# Patient Record
Sex: Male | Born: 1995 | Race: White | Hispanic: No | Marital: Single | Smoking: Never smoker
Health system: Southern US, Community
[De-identification: ages and names within clinical notes are randomized; demographics above are authoritative.]

---

## 2020-03-16 ENCOUNTER — Emergency Department
Admission: EM | Admit: 2020-03-16 | Discharge: 2020-03-16 | Disposition: A | Discharge status: Home / Self Care | Payer: Medicaid Other | Attending: Emergency Medicine | Admitting: Emergency Medicine

## 2020-03-16 ENCOUNTER — Emergency Department: Payer: Medicaid Other

## 2020-03-16 ENCOUNTER — Other Ambulatory Visit: Payer: Self-pay

## 2020-03-16 DIAGNOSIS — R079 Chest pain, unspecified: Secondary | ICD-10-CM

## 2020-03-16 DIAGNOSIS — K209 Esophagitis, unspecified without bleeding: Secondary | ICD-10-CM | POA: Insufficient documentation

## 2020-03-16 LAB — CBC
HCT: 45.7 % (ref 39.0–52.0)
Hemoglobin: 15.4 g/dL (ref 13.0–17.0)
MCH: 27.9 pg (ref 26.0–34.0)
MCHC: 33.7 g/dL (ref 30.0–36.0)
MCV: 82.8 fL (ref 80.0–100.0)
Platelets: 224 10*3/uL (ref 150–400)
RBC: 5.52 MIL/uL (ref 4.22–5.81)
RDW: 12.1 % (ref 11.5–15.5)
WBC: 6.8 10*3/uL (ref 4.0–10.5)
nRBC: 0 % (ref 0.0–0.2)

## 2020-03-16 LAB — TROPONIN I (HIGH SENSITIVITY)
Troponin I (High Sensitivity): 3 ng/L (ref <18)
Troponin I (High Sensitivity): 3 ng/L (ref <18)

## 2020-03-16 LAB — BASIC METABOLIC PANEL
Anion gap: 8 (ref 5–15)
BUN: 8 mg/dL (ref 6–20)
CO2: 26 mmol/L (ref 22–32)
Calcium: 9.6 mg/dL (ref 8.9–10.3)
Chloride: 102 mmol/L (ref 98–111)
Creatinine, Ser: 0.92 mg/dL (ref 0.61–1.24)
GFR, Estimated: >60 mL/min (ref >60)
Glucose, Bld: 91 mg/dL (ref 70–99)
Potassium: 4 mmol/L (ref 3.5–5.1)
Sodium: 136 mmol/L (ref 135–145)

## 2020-03-16 IMAGING — CR DG CHEST 2V
1 series · 2 of 2 positions shown · non-contrast
Comparison: None.

CLINICAL DATA: Chest pain

EXAM:
CHEST - 2 VIEW

[Series 1: dg chest 2 view · 0.14mm/px · 2 of 2 slices shown]
[im 1/2]
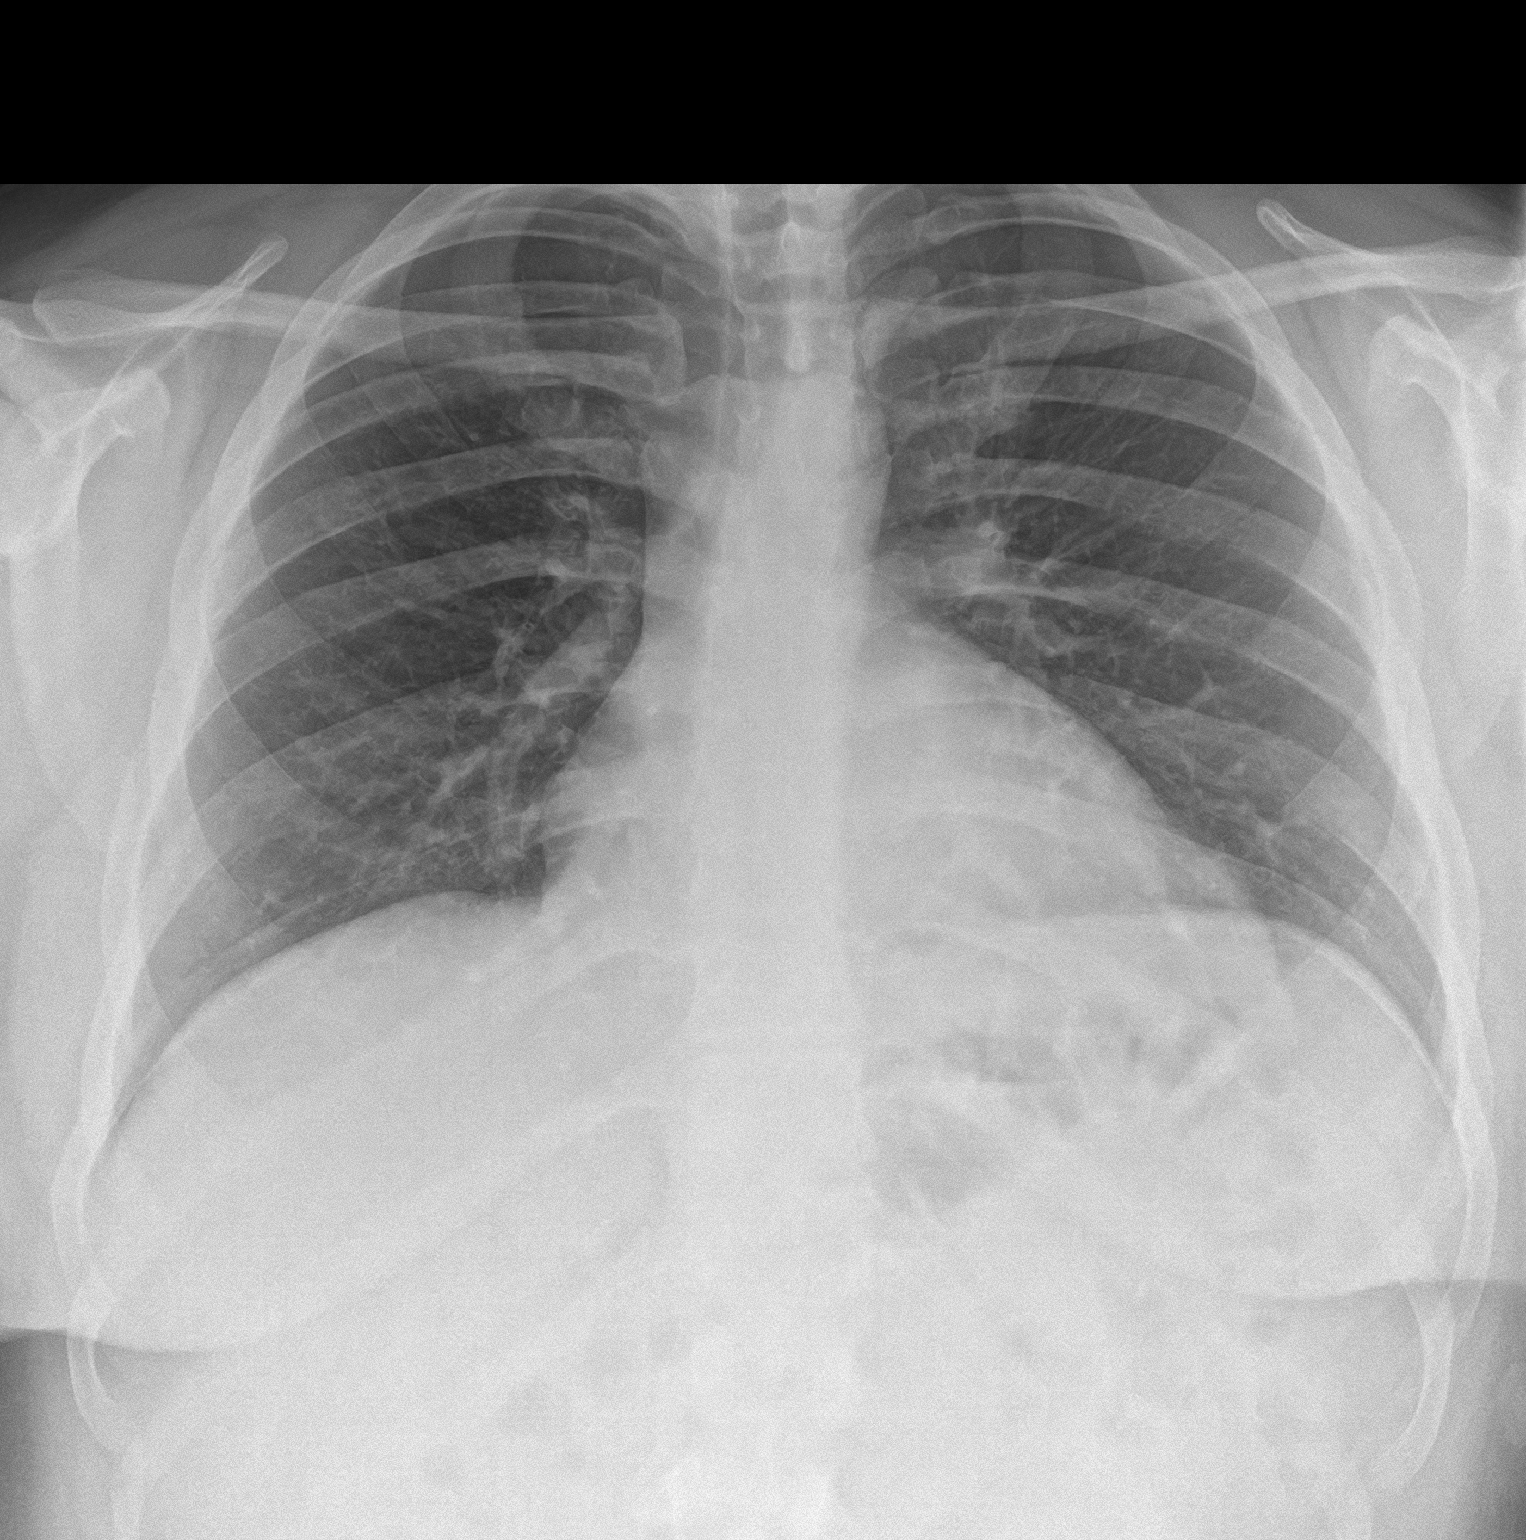
[im 2/2]
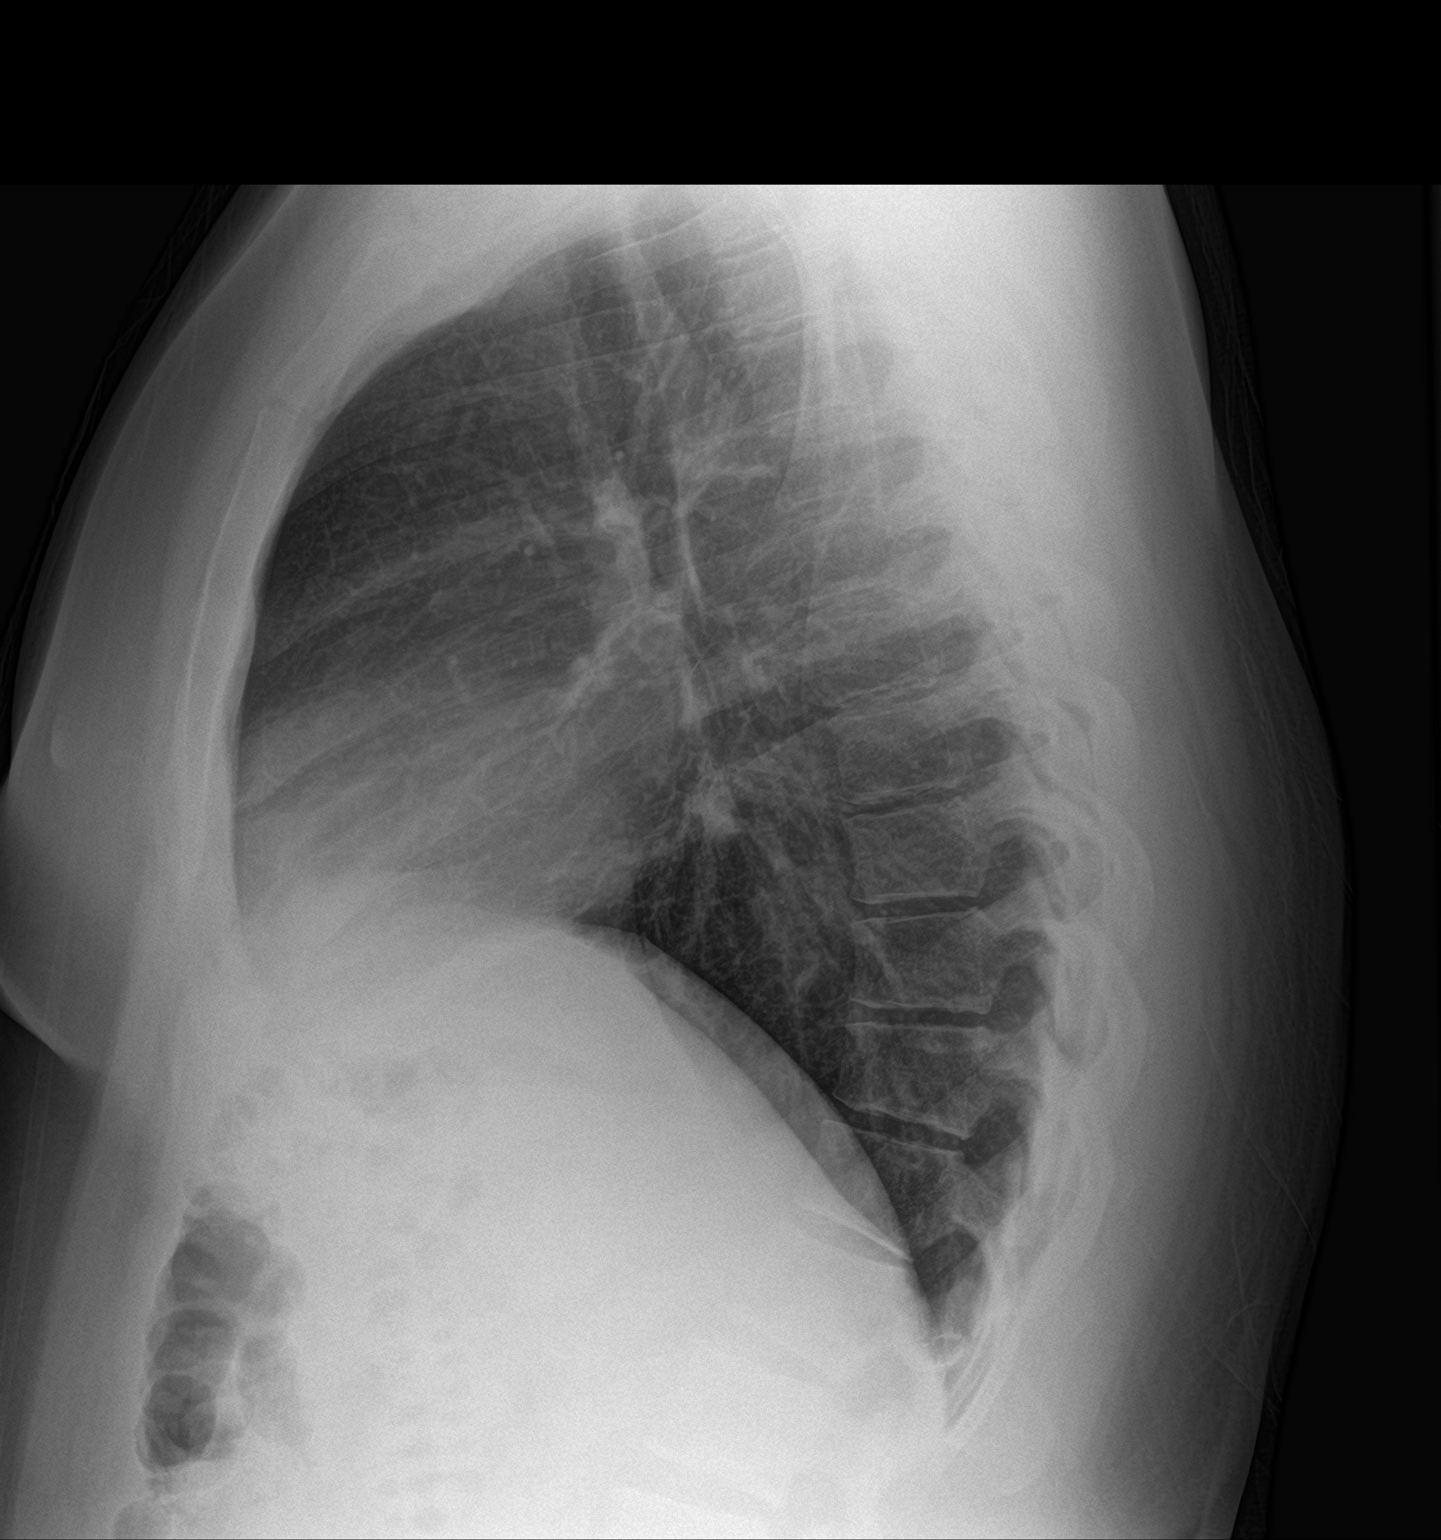

[2 of 2 positions shown; findings below may reference images not displayed]

FINDINGS: The heart size and mediastinal contours are within normal limits.
Both lungs are clear. The visualized skeletal structures are
unremarkable.
IMPRESSION: No active cardiopulmonary disease.

## 2020-03-16 MED ORDER — PANTOPRAZOLE SODIUM 40 MG PO TBEC: 40.0000 mg | DELAYED_RELEASE_TABLET | Freq: Every day | ORAL | 1 refills | Status: AC

## 2020-03-16 NOTE — ED Provider Notes (Signed)
[facility] Emergency Department Provider Note  ____________________________________________   Event Date/Time   First MD Initiated Contact with Patient 03/16/20 1736     (approximate)  I have reviewed the triage vital signs and the nursing notes.   HISTORY  Chief Complaint Chest Pain    HPI Thomas Terry is a 25 y.o. male presents emergency department complaining of chest pain on and off for 3 days.  Patient states he had some heartburn this morning and then had left-sided chest pain.  States pain will radiate to the left arm.  Family history of CAD as his mother had a heart attack 2 years ago.  He denies any shortness of breath on exertion.  No swelling of the lower extremities.  No history of Covid.  He is not vaccinated for Covid.  Has not taking any over-the-counter medicines for the pain.  States the pain in the chest was worse when he twisted.    History reviewed. No pertinent past medical history.  There are no problems to display for this patient.   History reviewed. No pertinent surgical history.  Prior to Admission medications   Medication Sig Start Date End Date Taking? Authorizing Provider  pantoprazole (PROTONIX) 40 MG tablet Take 1 tablet (40 mg total) by mouth daily. 03/16/20 03/16/21 Yes Zaryiah Barz, Roselyn Bering, PA-C    Allergies Patient has no allergy information on record.  No family history on file.  Social History    Review of Systems  Constitutional: No fever/chills Eyes: No visual changes. ENT: No sore throat. Respiratory: Denies cough Cardiovascular: Positive chest pain Gastrointestinal: Denies abdominal pain Genitourinary: Negative for dysuria. Musculoskeletal: Negative for back pain. Skin: Negative for rash. Psychiatric: no mood changes,     ____________________________________________   PHYSICAL EXAM:  VITAL SIGNS: ED Triage Vitals  Enc Vitals Group     BP 03/16/20 1317 125/72     Pulse Rate 03/16/20 1317 77      Resp 03/16/20 1317 18     Temp 03/16/20 1317 99 F (37.2 C)     Temp Source 03/16/20 1555 Oral     SpO2 03/16/20 1317 96 %     Weight 03/16/20 1320 297 lb (134.7 kg)     Height 03/16/20 1320 5\' 7"  (1.702 m)     Head Circumference --      Peak Flow --      Pain Score 03/16/20 1320 0     Pain Loc --      Pain Edu? --      Excl. in GC? --     Constitutional: Alert and oriented. Well appearing and in no acute distress. Eyes: Conjunctivae are normal.  Head: Atraumatic. Nose: No congestion/rhinnorhea. Mouth/Throat: Mucous membranes are moist.   Neck:  supple no lymphadenopathy noted Cardiovascular: Normal rate, regular rhythm. Heart sounds are normal Respiratory: Normal respiratory effort.  No retractions, lungs c t a  Abd: soft nontender bs normal all 4 quad GU: deferred Musculoskeletal: FROM all extremities, warm and well perfused Neurologic:  Normal speech and language.  Skin:  Skin is warm, dry and intact. No rash noted. Psychiatric: Mood and affect are normal. Speech and behavior are normal.  ____________________________________________   LABS (all labs ordered are listed, but only abnormal results are displayed)  Labs Reviewed  BASIC METABOLIC PANEL  CBC  TROPONIN I (HIGH SENSITIVITY)  TROPONIN I (HIGH SENSITIVITY)   ____________________________________________   ____________________________________________  RADIOLOGY  Chest x-ray  ____________________________________________   PROCEDURES  Procedure(s) performed:  No  Procedures    ____________________________________________   INITIAL IMPRESSION / ASSESSMENT AND PLAN / ED COURSE  Pertinent labs & imaging results that were available during my care of the patient were reviewed by me and considered in my medical decision making (see chart for details).   Patient is 25 year old male presents with chest pain.  See HPI.  Physical exam shows patient appear well.  Vitals are stable.  DDx: MI,  nonspecific chest pain, angina, esophagitis, myocarditis or pericarditis  CBC, basic metabolic panel troponin are all normal  EKG shows normal sinus rhythm, see physician read  Chest x-ray reviewed by me and confirmed by radiology as normal  Discussed findings with patient and his mother.  Feel this is more of an esophagitis due to his heartburn and pain in the chest is mostly with twisting.  He was given a prescription for Protonix.  He is to follow-up with his regular doctor if not improving in 2 to 3 days.  Return emergency department worsening.  Follow-up with cardiology if he feels the Protonix is not helping.  If he develops shortness of breath on exertion he should return emergency department or call cardiologist.  They state they understand.  Is discharged stable condition peer     Thomas Terry was evaluated in Emergency Department on 03/16/2020 for the symptoms described in the history of present illness. He was evaluated in the context of the global COVID-19 pandemic, which necessitated consideration that the patient might be at risk for infection with the SARS-CoV-2 virus that causes COVID-19. Institutional protocols and algorithms that pertain to the evaluation of patients at risk for COVID-19 are in a state of rapid change based on information released by regulatory bodies including the CDC and federal and state organizations. These policies and algorithms were followed during the patient's care in the ED.    As part of my medical decision making, I reviewed the following data within the electronic MEDICAL RECORD NUMBER History obtained from family, Nursing notes reviewed and incorporated, Labs reviewed , EKG interpreted NSR, Old chart reviewed, Radiograph reviewed , Notes from prior ED visits and [place] Controlled Substance Database  ____________________________________________   FINAL CLINICAL IMPRESSION(S) / ED DIAGNOSES  Final diagnoses:  Nonspecific chest pain  Esophagitis       NEW MEDICATIONS STARTED DURING THIS VISIT:  Discharge Medication List as of 03/16/2020  5:54 PM    START taking these medications   Details  pantoprazole (PROTONIX) 40 MG tablet Take 1 tablet (40 mg total) by mouth daily., Starting Tue 03/16/2020, Until Wed 03/16/2021, Normal         Note:  This document was prepared using Dragon voice recognition software and may include unintentional dictation errors.    Faythe Ghee, PA-C 03/16/20 1807    Shaune Pollack, MD 03/17/20 1539

## 2020-03-16 NOTE — ED Triage Notes (Signed)
Pt comes via POV from home with c/o CP for 3 days. Pt states left sided with radiation to left arm. Pt states aching pain.

## 2021-06-11 ENCOUNTER — Emergency Department
Admission: EM | Admit: 2021-06-11 | Discharge: 2021-06-11 | Disposition: A | Discharge status: Home / Self Care | Payer: Medicaid Other | Attending: Emergency Medicine | Admitting: Emergency Medicine

## 2021-06-11 ENCOUNTER — Emergency Department: Payer: Medicaid Other

## 2021-06-11 ENCOUNTER — Other Ambulatory Visit: Payer: Self-pay

## 2021-06-11 DIAGNOSIS — R079 Chest pain, unspecified: Secondary | ICD-10-CM | POA: Insufficient documentation

## 2021-06-11 DIAGNOSIS — R202 Paresthesia of skin: Secondary | ICD-10-CM | POA: Diagnosis not present

## 2021-06-11 LAB — BASIC METABOLIC PANEL
Anion gap: 6 (ref 5–15)
BUN: 12 mg/dL (ref 6–20)
CO2: 23 mmol/L (ref 22–32)
Calcium: 9.4 mg/dL (ref 8.9–10.3)
Chloride: 109 mmol/L (ref 98–111)
Creatinine, Ser: 1.21 mg/dL (ref 0.61–1.24)
GFR, Estimated: >60 mL/min (ref >60)
Glucose, Bld: 112 mg/dL — ABNORMAL HIGH (ref 70–99)
Potassium: 3.6 mmol/L (ref 3.5–5.1)
Sodium: 138 mmol/L (ref 135–145)

## 2021-06-11 LAB — CBC
HCT: 46.5 % (ref 39.0–52.0)
Hemoglobin: 15.8 g/dL (ref 13.0–17.0)
MCH: 27.6 pg (ref 26.0–34.0)
MCHC: 34 g/dL (ref 30.0–36.0)
MCV: 81.3 fL (ref 80.0–100.0)
Platelets: 272 10*3/uL (ref 150–400)
RBC: 5.72 MIL/uL (ref 4.22–5.81)
RDW: 12 % (ref 11.5–15.5)
WBC: 7.4 10*3/uL (ref 4.0–10.5)
nRBC: 0 % (ref 0.0–0.2)

## 2021-06-11 LAB — TROPONIN I (HIGH SENSITIVITY)
Troponin I (High Sensitivity): 7 ng/L (ref <18)
Troponin I (High Sensitivity): 7 ng/L (ref <18)

## 2021-06-11 IMAGING — CR DG CHEST 2V
1 series · 2 of 2 positions shown · non-contrast
Comparison: Chest radiograph dated [DATE].

CLINICAL DATA: Chest pain.

EXAM:
CHEST - 2 VIEW

[Series 1: dg chest 2 view · 0.14mm/px · 2 of 2 slices shown]
[im 1/2]
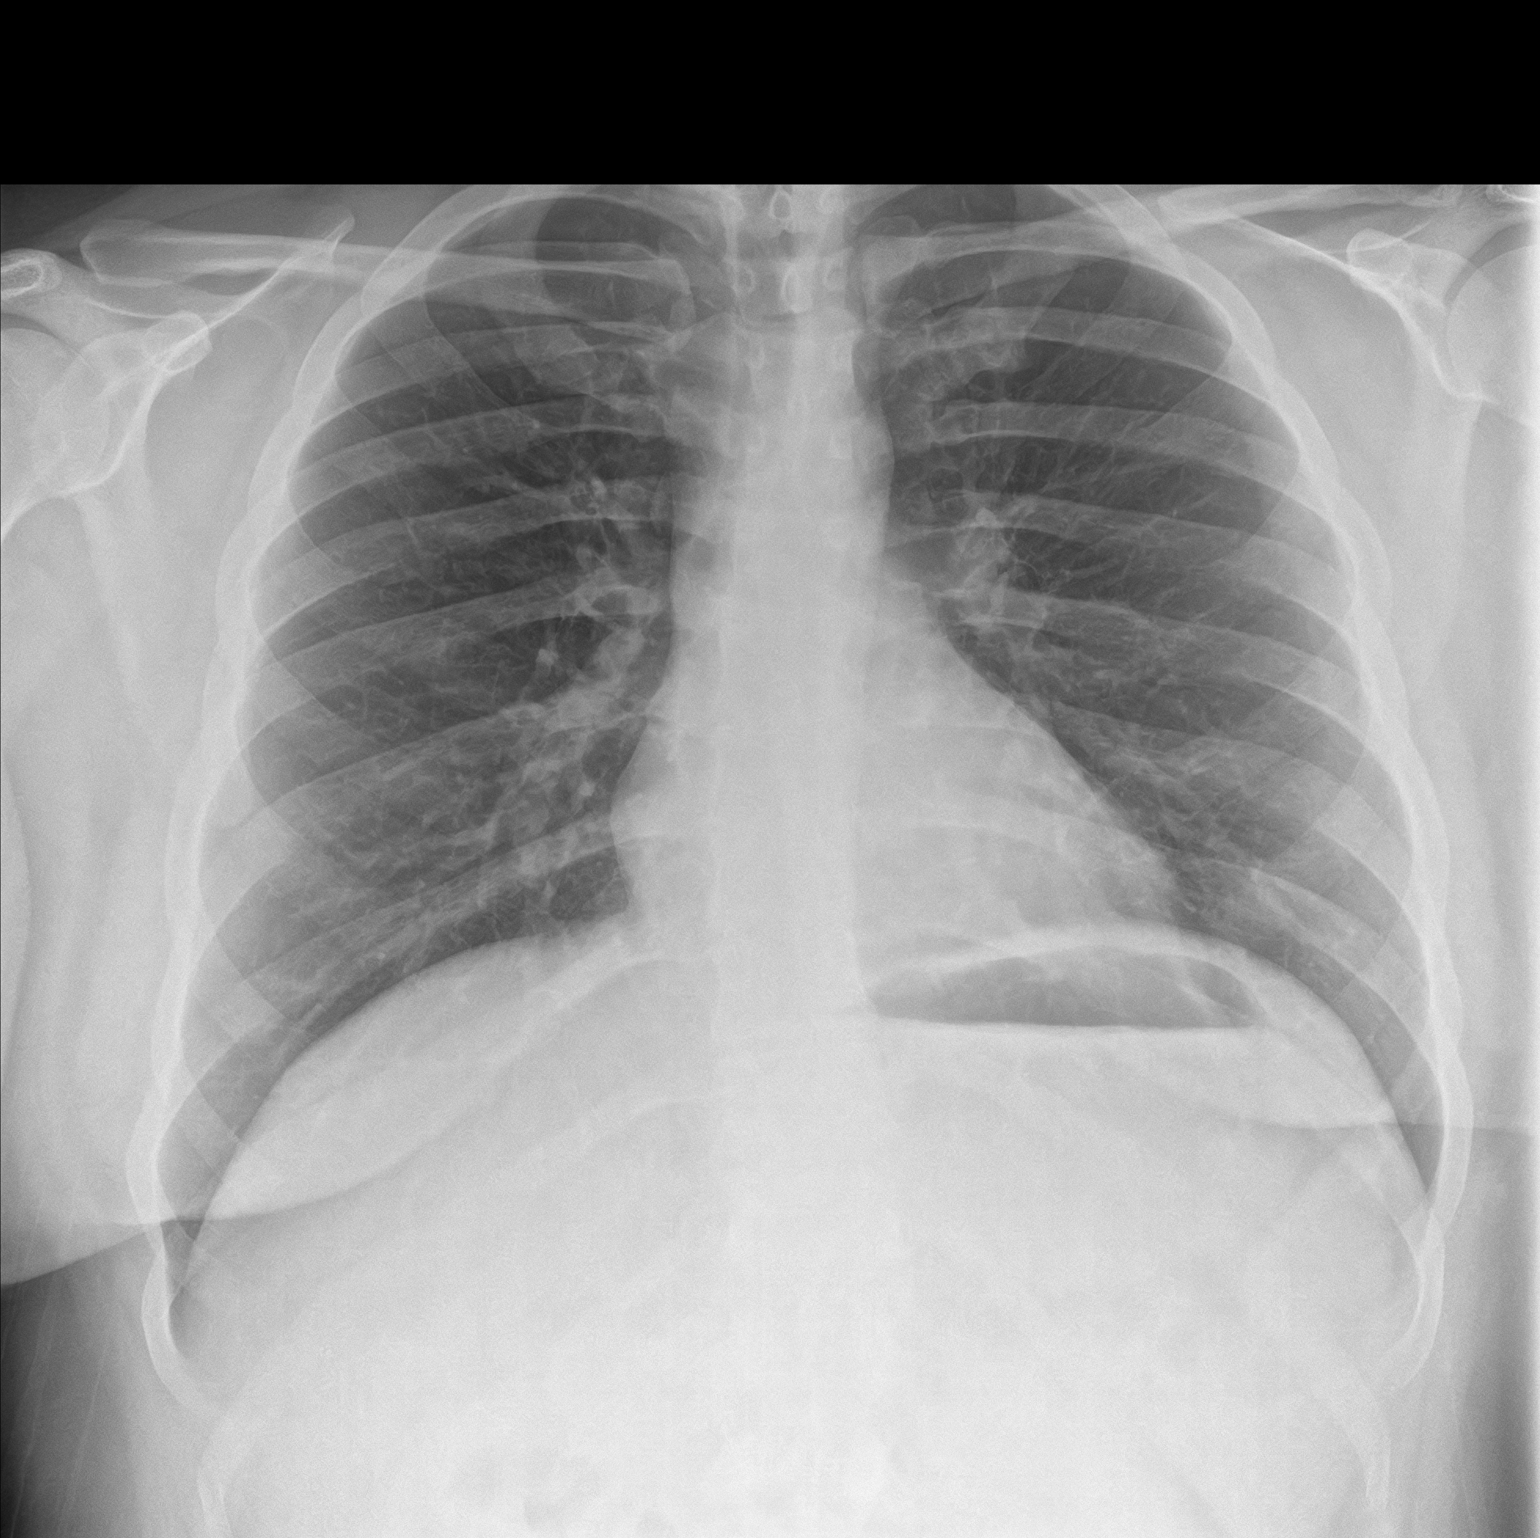
[im 2/2]
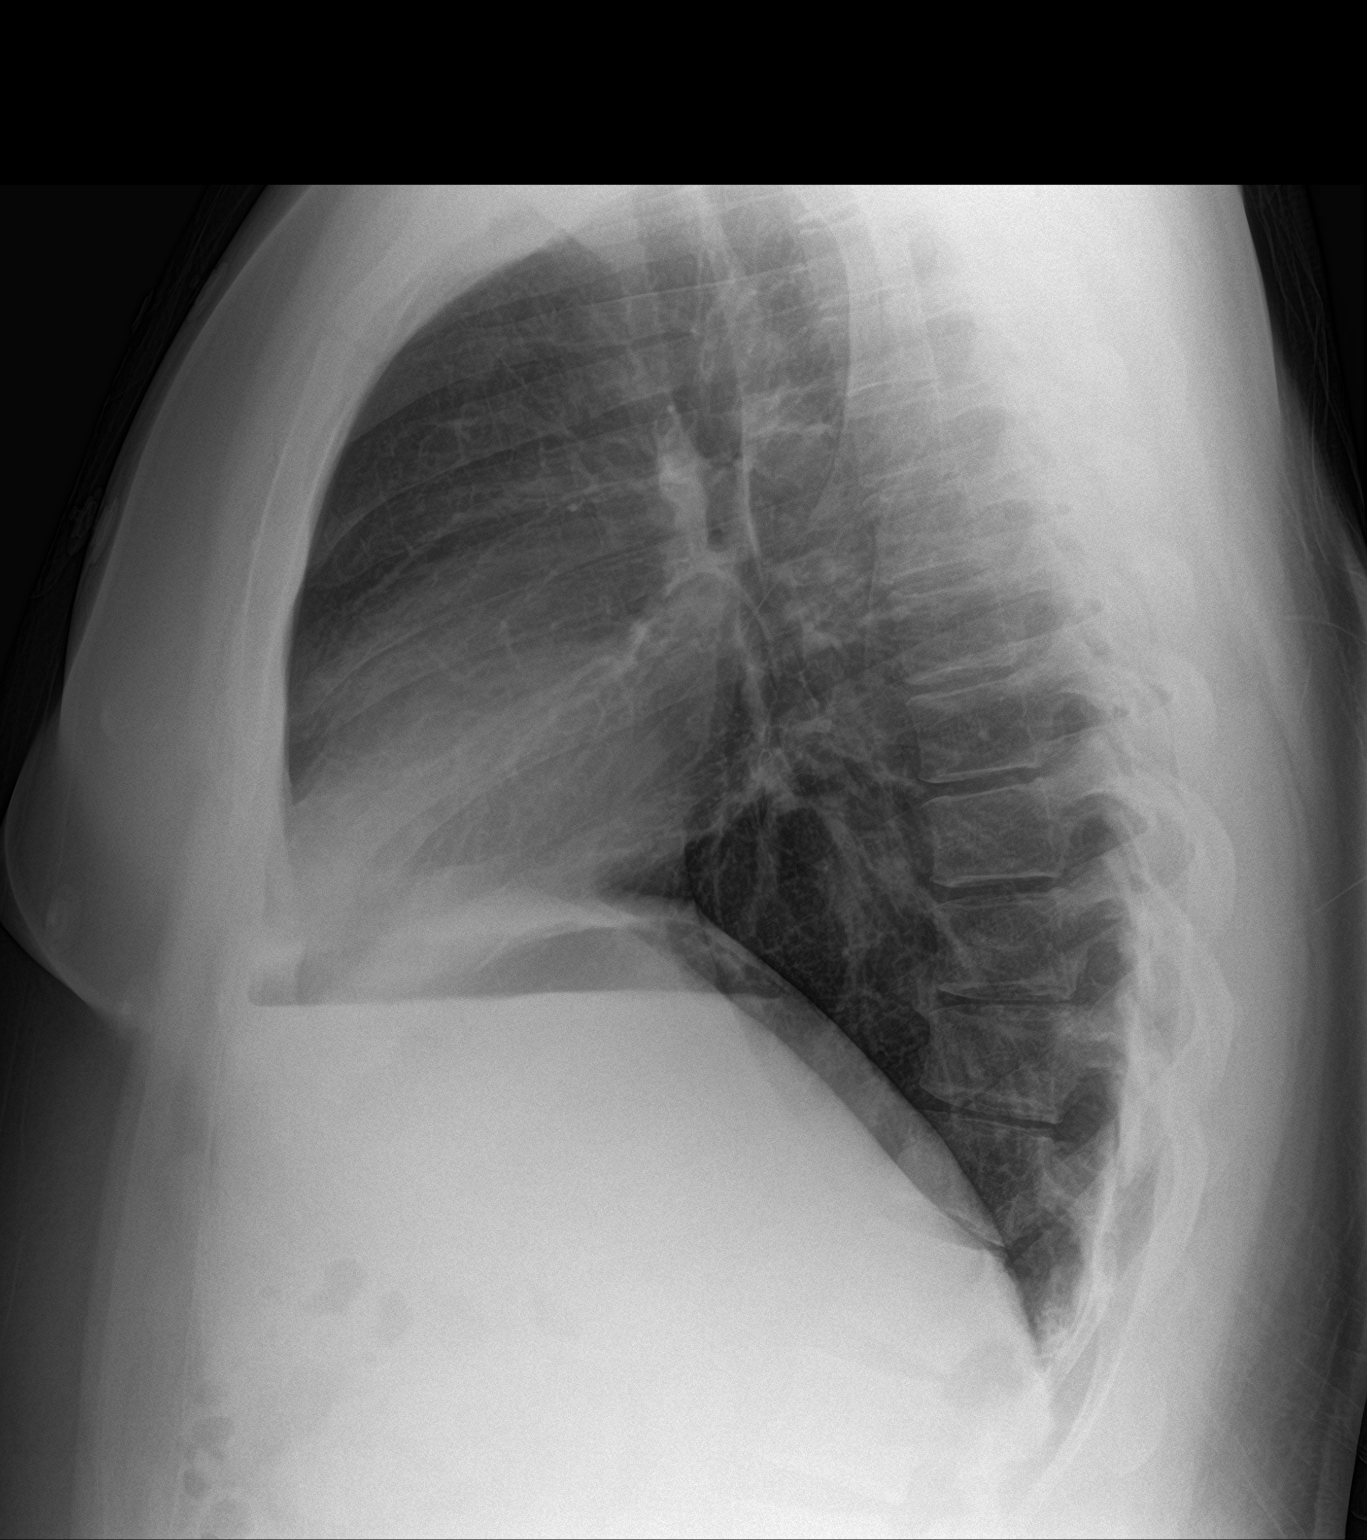

[2 of 2 positions shown; findings below may reference images not displayed]

FINDINGS: The heart size and mediastinal contours are within normal limits.
Both lungs are clear. The visualized skeletal structures are
unremarkable.
IMPRESSION: No active cardiopulmonary disease.

## 2021-06-11 MED ORDER — IBUPROFEN 600 MG PO TABS: 600.0000 mg | ORAL_TABLET | Freq: Four times a day (QID) | ORAL | 0 refills | Status: AC | PRN

## 2021-06-11 NOTE — ED Triage Notes (Signed)
Pt arrives with c/o chest pressure that started today. Per pt, he has been having some numbness and tingling for the pass 2 days in both arms. Pt denies n/v.

## 2021-06-11 NOTE — Discharge Instructions (Addendum)
Try to position pillows underneath your wrist as we discussed to take the ibuprofen and return if he develops recurrent symptoms or not going away worsening pain or any other concerns
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
## 2021-06-11 NOTE — ED Notes (Signed)
Patient verbalized discharge understanding  

## 2021-06-18 ENCOUNTER — Emergency Department: Payer: Medicaid Other

## 2021-06-18 ENCOUNTER — Other Ambulatory Visit: Payer: Self-pay

## 2021-06-18 ENCOUNTER — Emergency Department
Admission: EM | Admit: 2021-06-18 | Discharge: 2021-06-18 | Disposition: A | Discharge status: Home / Self Care | Payer: Medicaid Other | Attending: Emergency Medicine | Admitting: Emergency Medicine

## 2021-06-18 DIAGNOSIS — R200 Anesthesia of skin: Secondary | ICD-10-CM | POA: Diagnosis not present

## 2021-06-18 DIAGNOSIS — R202 Paresthesia of skin: Secondary | ICD-10-CM | POA: Insufficient documentation

## 2021-06-18 LAB — CBC WITH DIFFERENTIAL/PLATELET
Abs Immature Granulocytes: 0.01 10*3/uL (ref 0.00–0.07)
Basophils Absolute: 0 10*3/uL (ref 0.0–0.1)
Basophils Relative: 1 %
Eosinophils Absolute: 0.1 10*3/uL (ref 0.0–0.5)
Eosinophils Relative: 2 %
HCT: 46.5 % (ref 39.0–52.0)
Hemoglobin: 15.6 g/dL (ref 13.0–17.0)
Immature Granulocytes: 0 %
Lymphocytes Relative: 20 %
Lymphs Abs: 1.2 10*3/uL (ref 0.7–4.0)
MCH: 27.3 pg (ref 26.0–34.0)
MCHC: 33.5 g/dL (ref 30.0–36.0)
MCV: 81.3 fL (ref 80.0–100.0)
Monocytes Absolute: 0.3 10*3/uL (ref 0.1–1.0)
Monocytes Relative: 6 %
Neutro Abs: 4.3 10*3/uL (ref 1.7–7.7)
Neutrophils Relative %: 71 %
Platelets: 248 10*3/uL (ref 150–400)
RBC: 5.72 MIL/uL (ref 4.22–5.81)
RDW: 12.1 % (ref 11.5–15.5)
WBC: 6 10*3/uL (ref 4.0–10.5)
nRBC: 0 % (ref 0.0–0.2)

## 2021-06-18 LAB — COMPREHENSIVE METABOLIC PANEL
ALT: 17 U/L (ref 0–44)
AST: 21 U/L (ref 15–41)
Albumin: 4.3 g/dL (ref 3.5–5.0)
Alkaline Phosphatase: 66 U/L (ref 38–126)
Anion gap: 8 (ref 5–15)
BUN: 9 mg/dL (ref 6–20)
CO2: 24 mmol/L (ref 22–32)
Calcium: 9.2 mg/dL (ref 8.9–10.3)
Chloride: 107 mmol/L (ref 98–111)
Creatinine, Ser: 0.86 mg/dL (ref 0.61–1.24)
GFR, Estimated: >60 mL/min (ref >60)
Glucose, Bld: 119 mg/dL — ABNORMAL HIGH (ref 70–99)
Potassium: 3.4 mmol/L — ABNORMAL LOW (ref 3.5–5.1)
Sodium: 139 mmol/L (ref 135–145)
Total Bilirubin: 2.2 mg/dL — ABNORMAL HIGH (ref 0.3–1.2)
Total Protein: 7.7 g/dL (ref 6.5–8.1)

## 2021-06-18 IMAGING — MR MR CERVICAL SPINE W/O CM
5 series · 39 of 48 positions shown · non-contrast
Comparison: None Available.

CLINICAL DATA: Numbness and tingling in the extremities, concern
for demyelinating disease

EXAM:
MRI HEAD WITHOUT CONTRAST
MRI CERVICAL SPINE WITHOUT CONTRAST
TECHNIQUE: Multiplanar, multiecho pulse sequences of the brain and surrounding
structures, and cervical spine, to include the craniocervical
junction and cervicothoracic junction, were obtained without
intravenous contrast.

[Series 5: T2 · sagittal · 3.0mm · 0.62mm/px · 6 of 14 slices shown (1 of 2)]
[im 1/14]
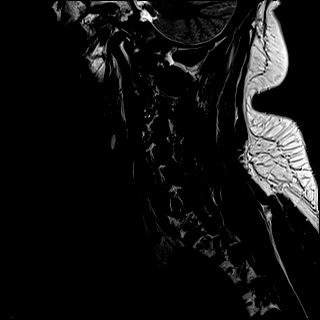
[im 3/14]
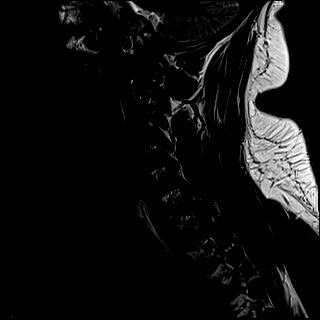
[im 6/14]
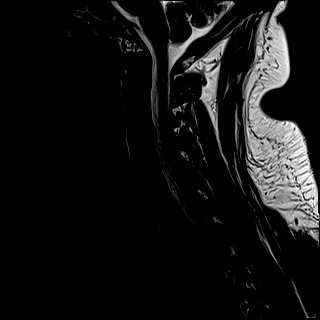
[im 8/14]
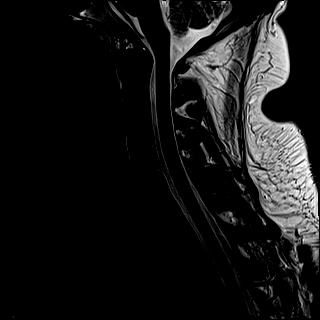
[im 11/14]
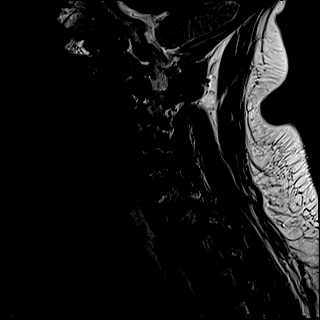
[im 14/14]
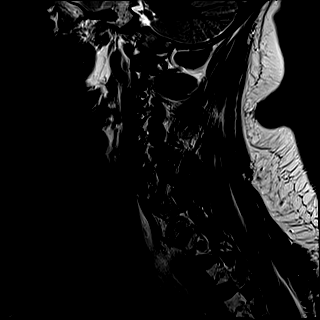

[Series 6: FLAIR · sagittal · 3.0mm · 0.78mm/px · 7 of 14 slices shown]
[im 1/14]
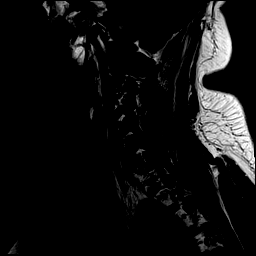
[im 3/14]
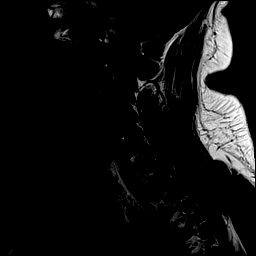
[im 5/14]
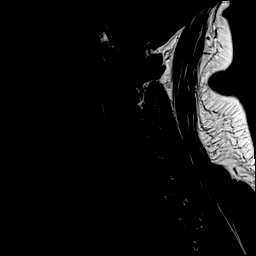
[im 7/14]
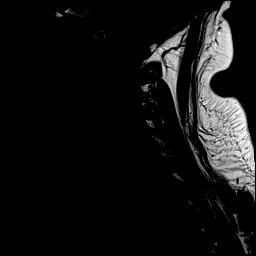
[im 9/14]
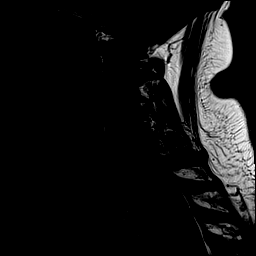
[im 11/14]
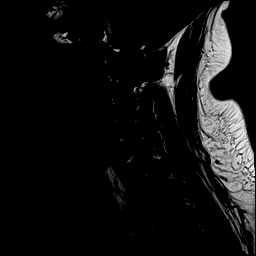
[im 14/14]
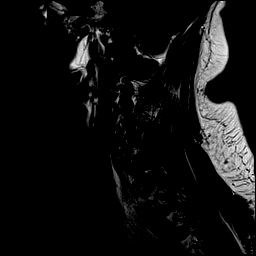

[Series 7: STIR · sagittal · 3.0mm · 0.62mm/px · 7 of 14 slices shown]
[im 1/14]
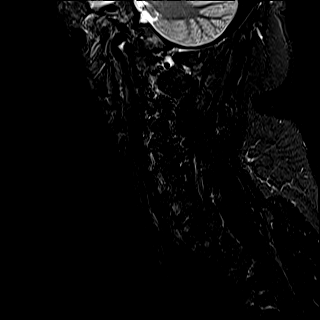
[im 3/14]
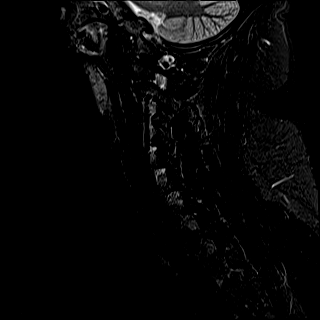
[im 5/14]
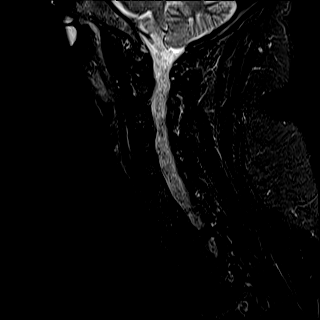
[im 7/14]
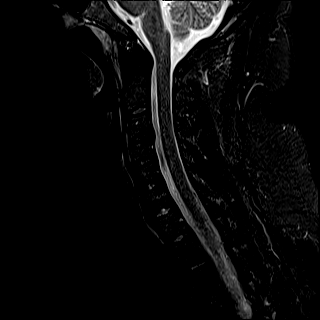
[im 9/14]
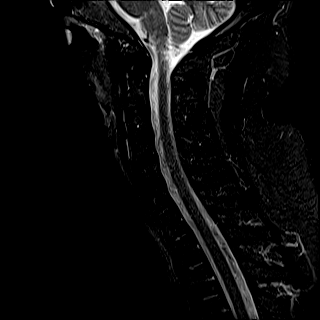
[im 11/14]
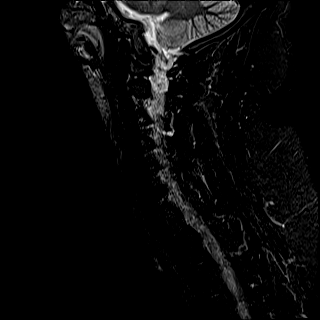
[im 14/14]
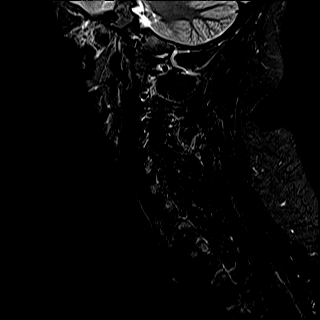

[Series 8: T2 · axial · 3.0mm · 0.70mm/px · z∈[-241,-154]mm · 11 of 28 slices shown (2 of 2)]
[im 1/28]
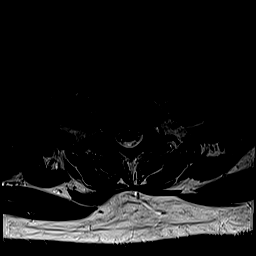
[im 3/28]
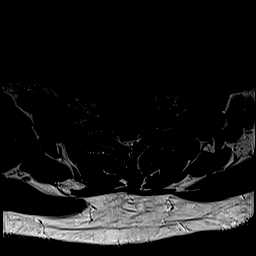
[im 5/28]
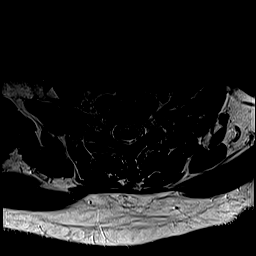
[im 7/28]
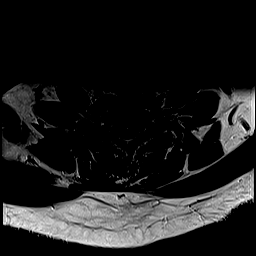
[im 9/28]
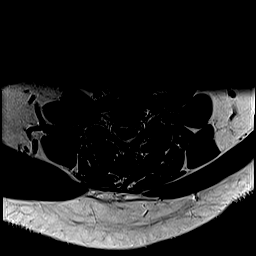
[im 11/28]
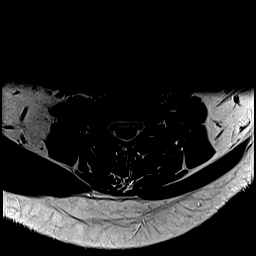
[im 13/28]
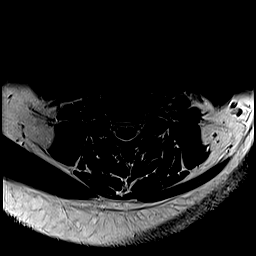
[im 15/28]
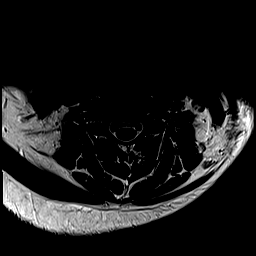
[im 19/28]
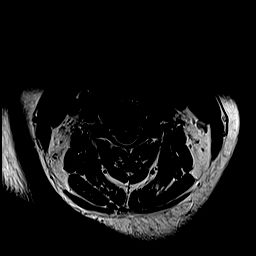
[im 23/28]
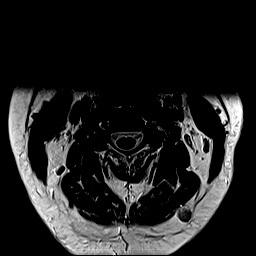
[im 28/28]
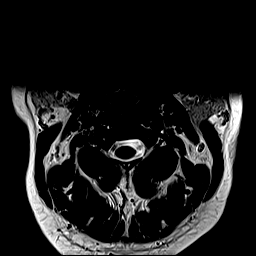

[Series 9: ax mpgr · axial · 3.0mm · 0.35mm/px · z∈[-241,-154]mm · 8 of 28 slices shown]
[im 1/28]
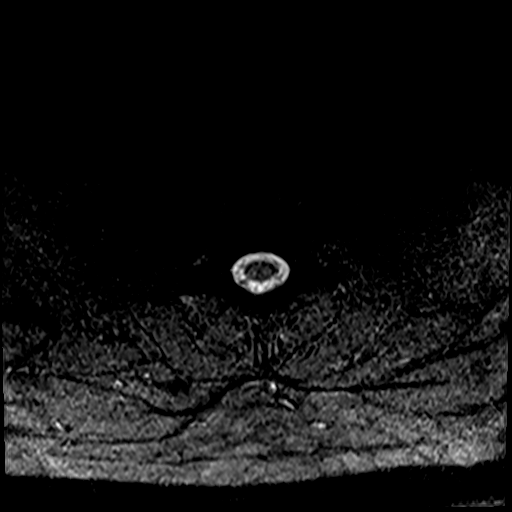
[im 5/28]
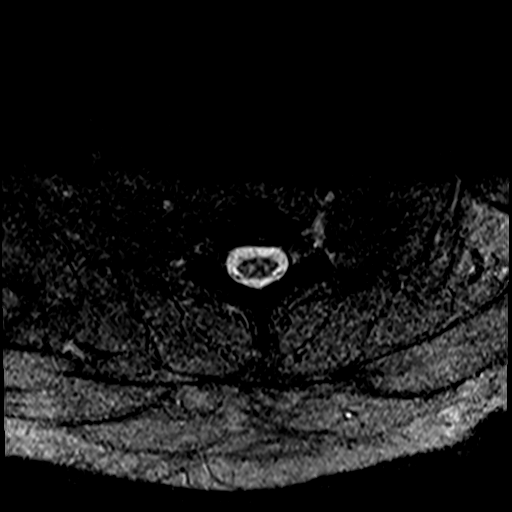
[im 9/28]
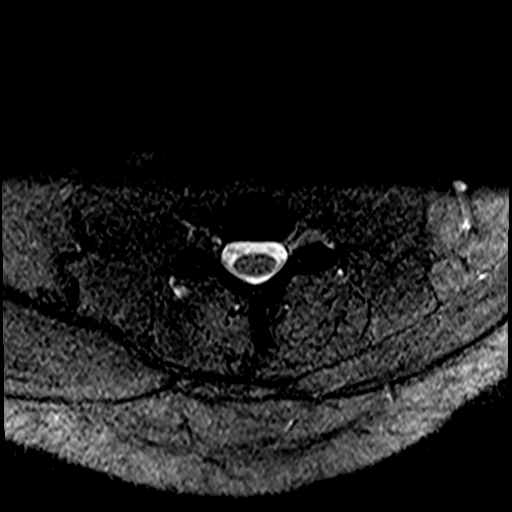
[im 13/28]
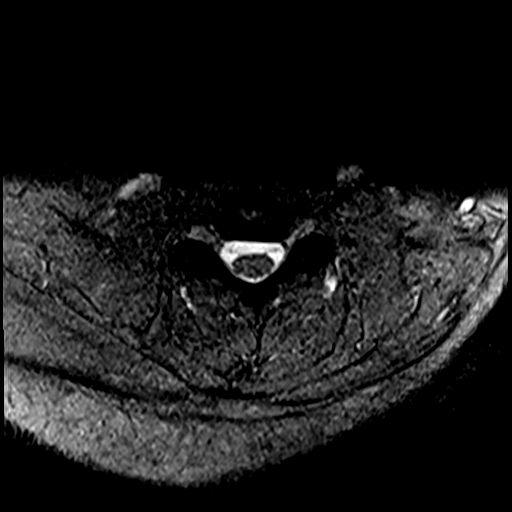
[im 15/28]
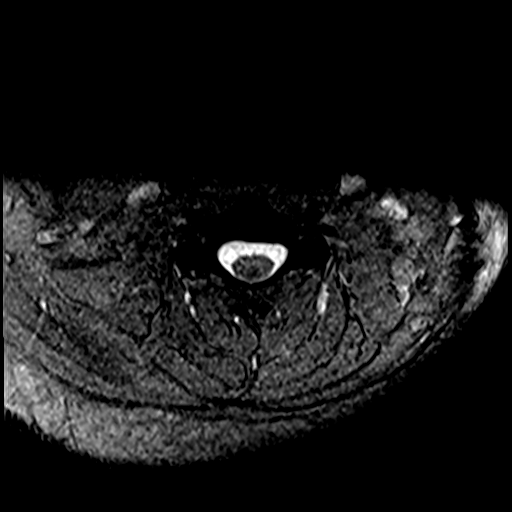
[im 19/28]
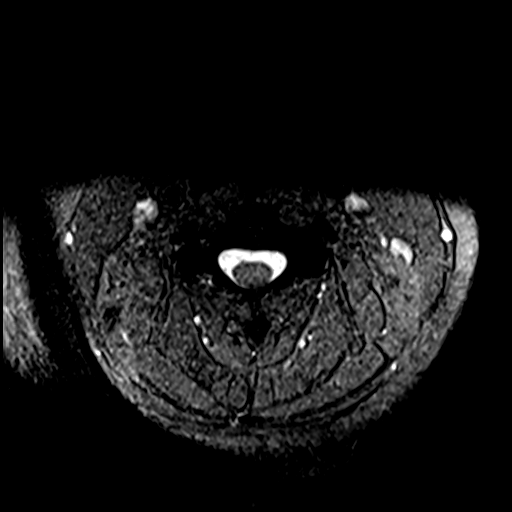
[im 23/28]
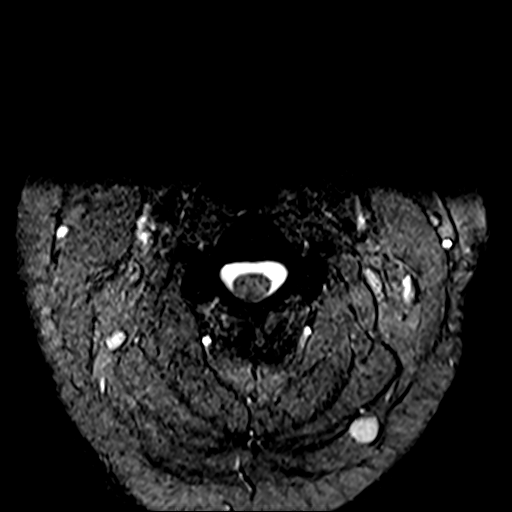
[im 28/28]
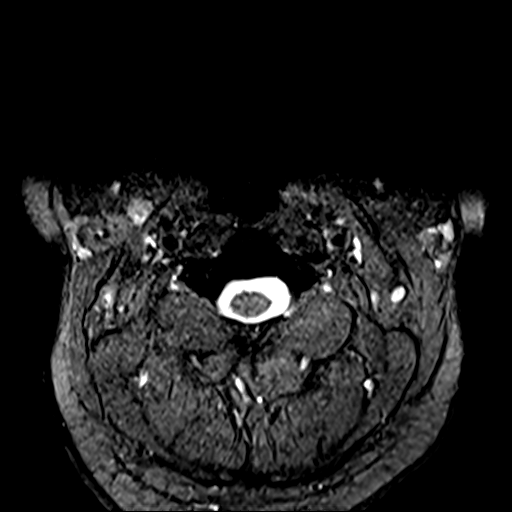

[39 of 48 positions shown; findings below may reference images not displayed]

FINDINGS: MRI HEAD FINDINGS

Brain: No restricted diffusion to suggest acute or subacute infarct.
No acute hemorrhage, mass, mass effect, or midline shift. No
hydrocephalus or extra-axial collection. No hemosiderin deposition
to suggest remote hemorrhage. A few small T2 hyperintense foci are
noted in the right frontal lobe white matter (series 15, image 31,
39, 41.)

Vascular: Normal flow voids.

Skull and upper cervical spine: Normal marrow signal.

Sinuses/Orbits: Minimal mucosal thickening in the ethmoid air cells.
The orbits are unremarkable.

Other: The mastoids are well aerated.

MRI CERVICAL SPINE FINDINGS

Alignment: Physiologic.

Vertebrae: No fracture, evidence of discitis, or bone lesion.

Cord: Normal signal and morphology. No T2 hyperintense lesions to
suggest demyelinating disease.

Posterior Fossa, vertebral arteries, paraspinal tissues: Negative.

Disc levels: No significant degenerative changes. No spinal canal
stenosis or neural foraminal narrowing.
IMPRESSION: 1. A few small T2 hyperintense foci are noted in the right frontal
lobe white matter, which are nonspecific and not particularly
characteristic for demyelinating disease. Attention on follow-up.
2. No spinal canal stenosis or neural foraminal narrowing in the
cervical spine. No evidence of demyelinating disease in the cervical
spinal cord.

## 2021-06-18 IMAGING — MR MR HEAD W/O CM
12 series · 48 of 48 positions shown · non-contrast
Comparison: None Available.

CLINICAL DATA: Numbness and tingling in the extremities, concern
for demyelinating disease

EXAM:
MRI HEAD WITHOUT CONTRAST
MRI CERVICAL SPINE WITHOUT CONTRAST
TECHNIQUE: Multiplanar, multiecho pulse sequences of the brain and surrounding
structures, and cervical spine, to include the craniocervical
junction and cervicothoracic junction, were obtained without
intravenous contrast.

[Series 5: ax dwi_tracew · axial · 3.0mm · 0.65mm/px · z∈[-85,+70]mm · 2 of 48 slices shown]
[im 1/48]
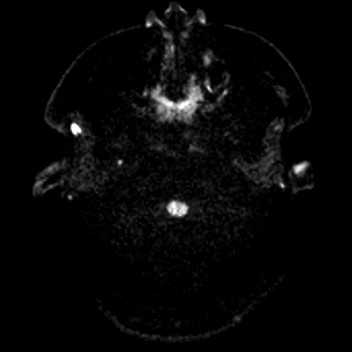
[im 48/48]
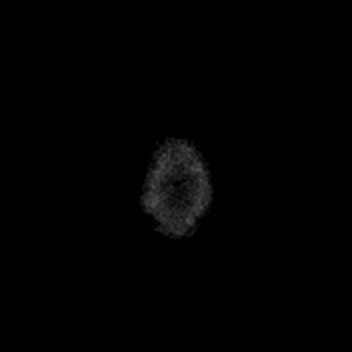

[Series 6: ax dwi_adc · axial · 3.0mm · 0.65mm/px · z∈[-85,+70]mm · 3 of 48 slices shown]
[im 1/48]
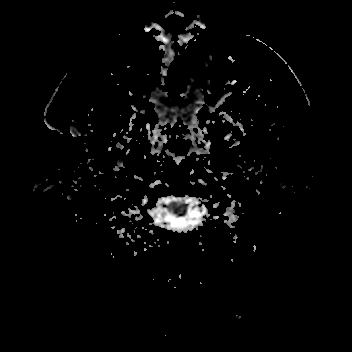
[im 24/48]
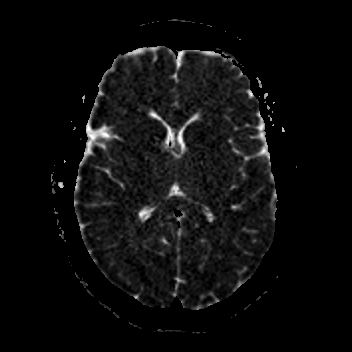
[im 48/48]
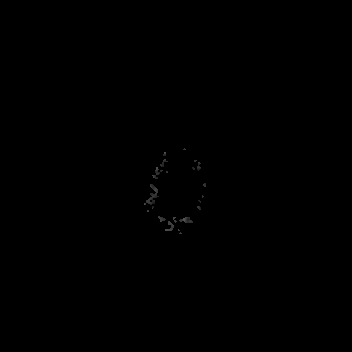

[Series 7: cor dwi_tracew · coronal · 5.0mm · 0.60mm/px · 2 of 36 slices shown]
[im 1/36]
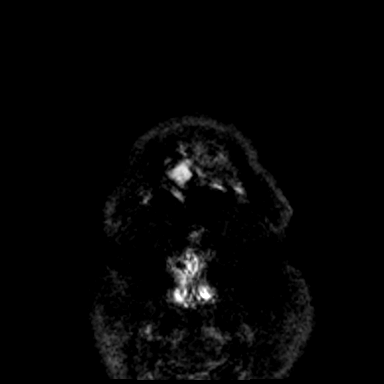
[im 36/36]
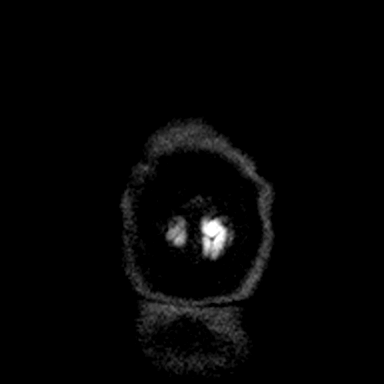

[Series 8: cor dwi_adc · coronal · 5.0mm · 0.60mm/px · 2 of 36 slices shown]
[im 1/36]
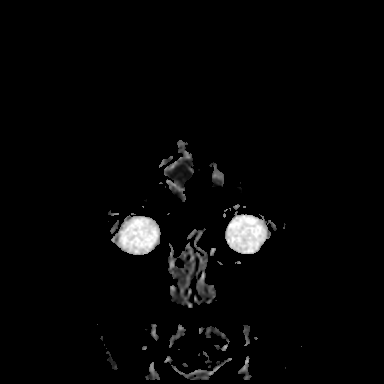
[im 36/36]
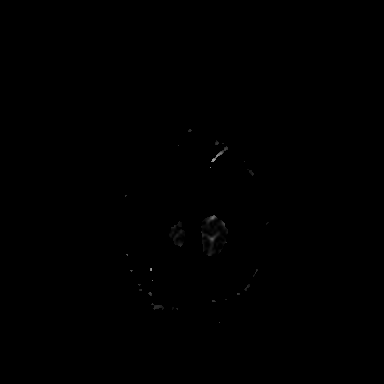

[Series 9: T1 · sagittal · 5.0mm · 0.62mm/px · 2 of 22 slices shown (1 of 2)]
[im 1/22]
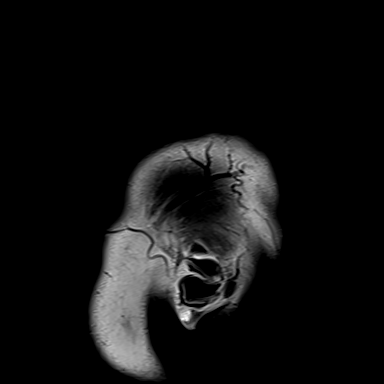
[im 22/22]
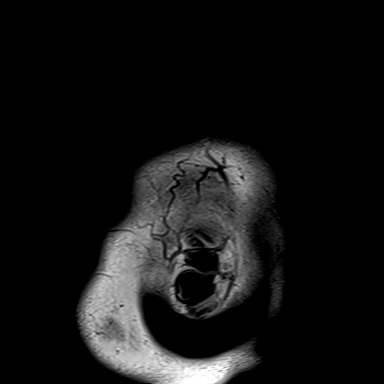

[Series 10: T2 · axial · 5.0mm · 0.53mm/px · z∈[-82,+67]mm · 2 of 26 slices shown (1 of 2)]
[im 1/26]
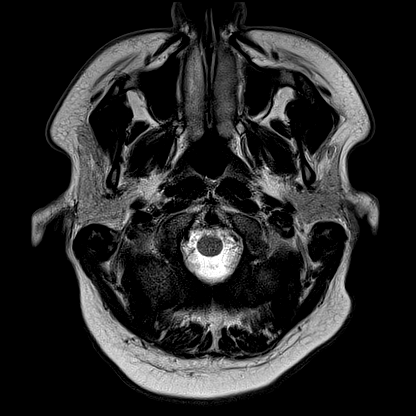
[im 26/26]
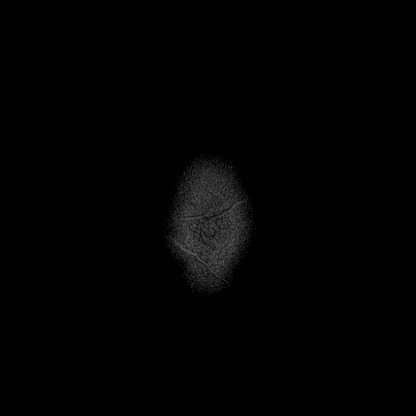

[Series 11: ax swi_mag · axial · 2.0mm · 0.90mm/px · z∈[-86,+71]mm · 6 of 80 slices shown]
[im 1/80]
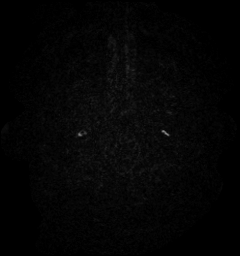
[im 16/80]
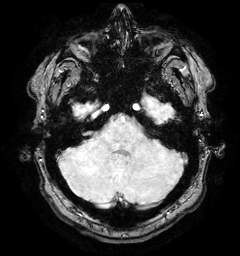
[im 32/80]
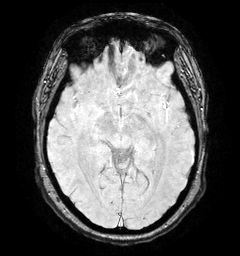
[im 48/80]
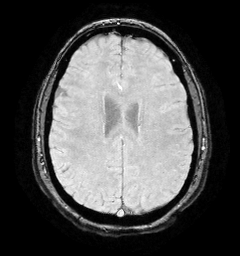
[im 64/80]
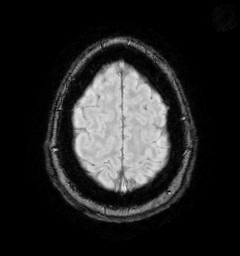
[im 80/80]
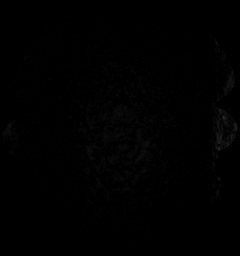

[Series 12: ax swi_pha · axial · 2.0mm · 0.90mm/px · z∈[-86,+71]mm · 6 of 80 slices shown]
[im 1/80]
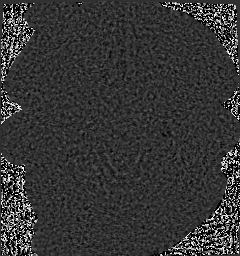
[im 16/80]
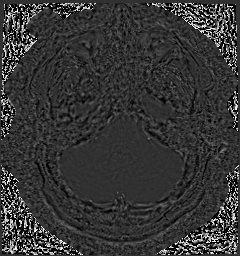
[im 32/80]
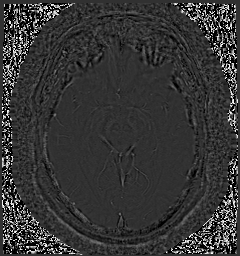
[im 48/80]
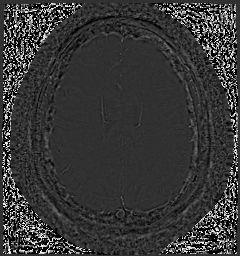
[im 64/80]
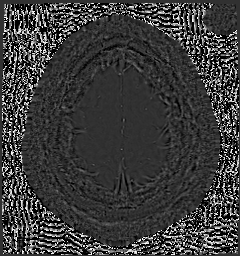
[im 80/80]
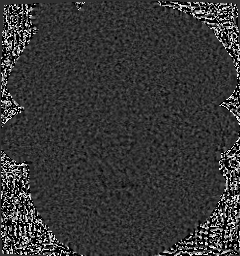

[Series 13: ax swi_swi · axial · 2.0mm · 0.90mm/px · z∈[-86,+71]mm · 6 of 80 slices shown]
[im 1/80]
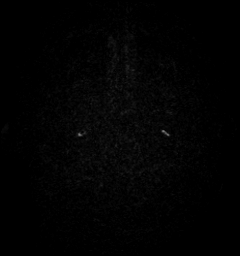
[im 16/80]
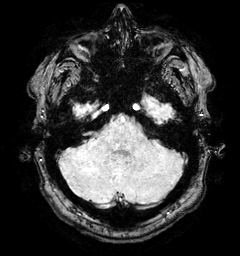
[im 32/80]
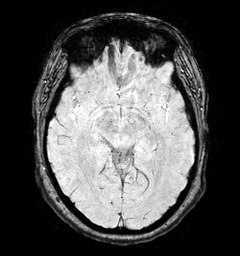
[im 48/80]
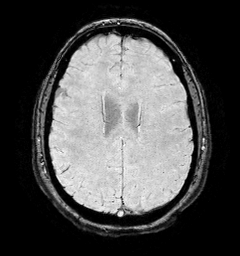
[im 64/80]
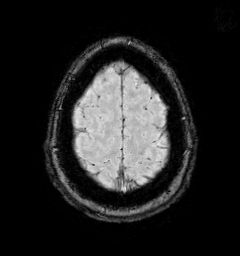
[im 80/80]
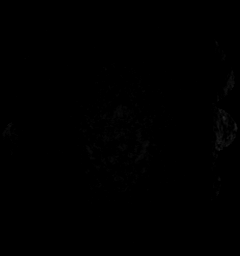

[Series 15: FLAIR · axial · 3.0mm · 0.53mm/px · z∈[-84,+69]mm · 4 of 52 slices shown]
[im 1/52]
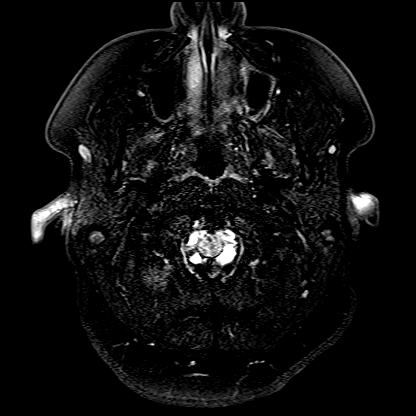
[im 18/52]
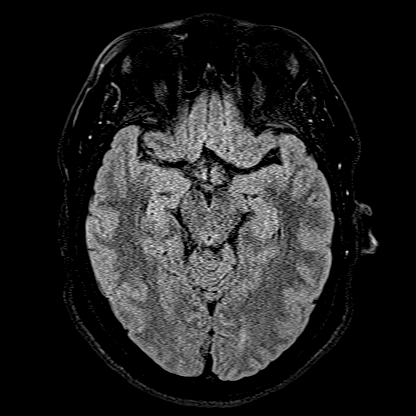
[im 35/52]
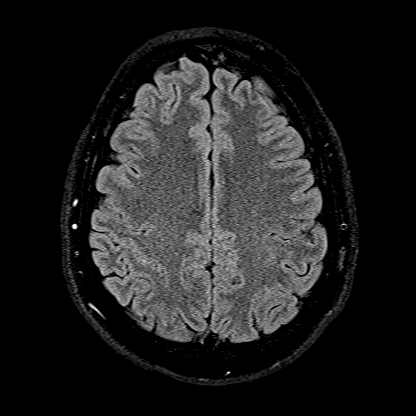
[im 52/52]
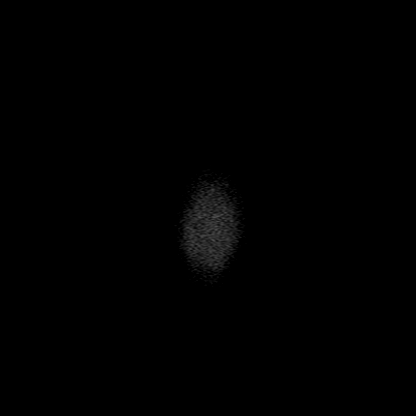

[Series 16: T1 · axial · 1.0mm · 0.98mm/px · z∈[-87,+72]mm · 11 of 160 slices shown (2 of 2)]
[im 1/160]
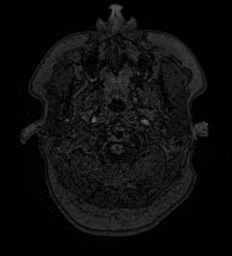
[im 16/160]
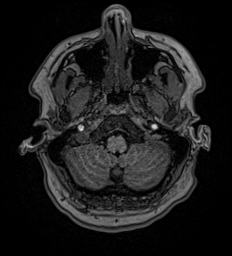
[im 32/160]
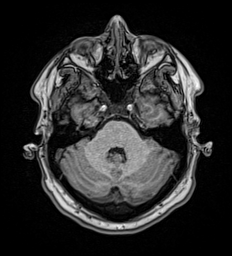
[im 48/160]
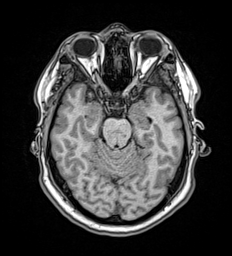
[im 64/160]
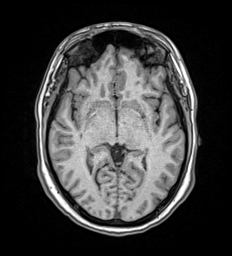
[im 80/160]
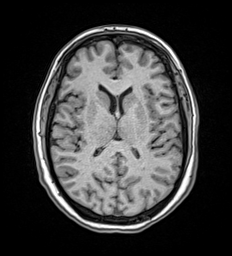
[im 96/160]
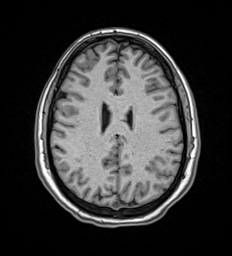
[im 112/160]
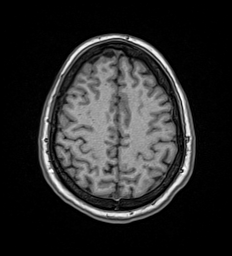
[im 128/160]
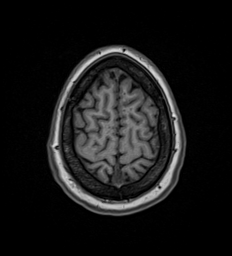
[im 144/160]
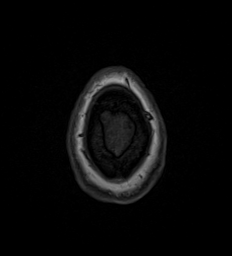
[im 160/160]
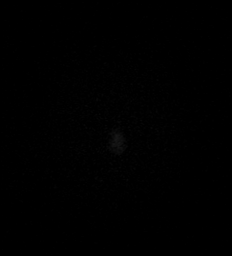

[Series 17: T2 · coronal · 5.0mm · 0.57mm/px · 2 of 28 slices shown (2 of 2)]
[im 1/28]
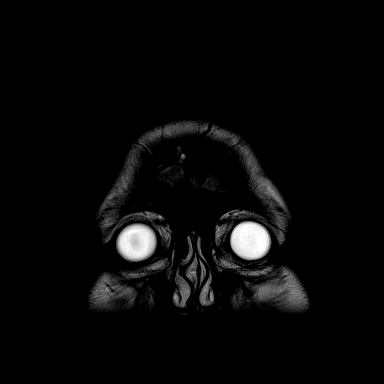
[im 28/28]
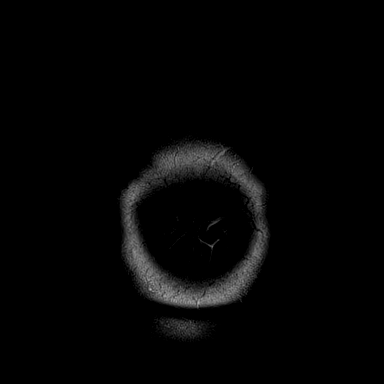

[48 of 48 positions shown; findings below may reference images not displayed]

FINDINGS: MRI HEAD FINDINGS

Brain: No restricted diffusion to suggest acute or subacute infarct.
No acute hemorrhage, mass, mass effect, or midline shift. No
hydrocephalus or extra-axial collection. No hemosiderin deposition
to suggest remote hemorrhage. A few small T2 hyperintense foci are
noted in the right frontal lobe white matter (series 15, image 31,
39, 41.)

Vascular: Normal flow voids.

Skull and upper cervical spine: Normal marrow signal.

Sinuses/Orbits: Minimal mucosal thickening in the ethmoid air cells.
The orbits are unremarkable.

Other: The mastoids are well aerated.

MRI CERVICAL SPINE FINDINGS

Alignment: Physiologic.

Vertebrae: No fracture, evidence of discitis, or bone lesion.

Cord: Normal signal and morphology. No T2 hyperintense lesions to
suggest demyelinating disease.

Posterior Fossa, vertebral arteries, paraspinal tissues: Negative.

Disc levels: No significant degenerative changes. No spinal canal
stenosis or neural foraminal narrowing.
IMPRESSION: 1. A few small T2 hyperintense foci are noted in the right frontal
lobe white matter, which are nonspecific and not particularly
characteristic for demyelinating disease. Attention on follow-up.
2. No spinal canal stenosis or neural foraminal narrowing in the
cervical spine. No evidence of demyelinating disease in the cervical
spinal cord.

## 2021-06-18 NOTE — ED Notes (Signed)
Pt transported to MRI 

## 2021-06-18 NOTE — ED Triage Notes (Signed)
Pt states he was seen here a week ago for numbness and tingling in his extremities- pt was discharged and was instructed to come back in the symptoms had not resolved in 1 week- pt states symptoms are still there and that the numbness has spread into his abd

## 2021-06-18 NOTE — ED Notes (Signed)
Pt returned from MRI °
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
## 2021-06-18 NOTE — Discharge Instructions (Signed)
Make an appointment to follow-up with Dr. Malvin Johns.  Your MRI is generally normal but shows areas called "T2 hyperintense foci" in the right front part of your brain.  These may not have anything to do with your symptoms but do need to be followed up by the neurologist to determine if you need any other tests.  Return to the ER for new, worsening, or persistent severe numbness, any weakness such as difficulty with grip, moving your arms or legs, difficulty walking, vision changes, difficulty speaking or understanding, or any other new or worsening symptoms that concern you.

## 2022-09-12 ENCOUNTER — Encounter: Payer: Self-pay | Admitting: Emergency Medicine

## 2022-09-12 ENCOUNTER — Other Ambulatory Visit: Payer: Self-pay

## 2022-09-12 ENCOUNTER — Emergency Department
Admission: EM | Admit: 2022-09-12 | Discharge: 2022-09-12 | Disposition: A | Discharge status: Home / Self Care | Payer: Medicaid Other | Attending: Emergency Medicine | Admitting: Emergency Medicine

## 2022-09-12 DIAGNOSIS — T59891A Toxic effect of other specified gases, fumes and vapors, accidental (unintentional), initial encounter: Secondary | ICD-10-CM | POA: Insufficient documentation

## 2022-09-12 DIAGNOSIS — Z7729 Contact with and (suspected ) exposure to other hazardous substances: Secondary | ICD-10-CM
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
